# Patient Record
Sex: Female | Born: 1980 | Hispanic: No | Marital: Single | Smoking: Former smoker
Health system: Southern US, Community
[De-identification: ages and names within clinical notes are randomized; demographics above are authoritative.]

## PROBLEM LIST (undated history)

## (undated) DIAGNOSIS — F419 Anxiety disorder, unspecified: Secondary | ICD-10-CM

## (undated) DIAGNOSIS — M797 Fibromyalgia: Secondary | ICD-10-CM

## (undated) HISTORY — PX: OOPHORECTOMY: SHX86

---

## 2010-03-13 ENCOUNTER — Emergency Department (HOSPITAL_BASED_OUTPATIENT_CLINIC_OR_DEPARTMENT_OTHER): Admission: EM | Admit: 2010-03-13 | Discharge: 2010-03-13 | Payer: Self-pay | Admitting: Emergency Medicine

## 2011-03-07 ENCOUNTER — Other Ambulatory Visit (HOSPITAL_BASED_OUTPATIENT_CLINIC_OR_DEPARTMENT_OTHER): Payer: Self-pay | Admitting: Family Medicine

## 2011-03-07 ENCOUNTER — Ambulatory Visit (HOSPITAL_BASED_OUTPATIENT_CLINIC_OR_DEPARTMENT_OTHER)
Admission: RE | Admit: 2011-03-07 | Discharge: 2011-03-07 | Disposition: A | Discharge status: Home / Self Care | Payer: 59 | Source: Ambulatory Visit | Attending: Family Medicine | Admitting: Family Medicine

## 2011-03-07 DIAGNOSIS — R109 Unspecified abdominal pain: Secondary | ICD-10-CM

## 2011-03-07 DIAGNOSIS — K7689 Other specified diseases of liver: Secondary | ICD-10-CM | POA: Insufficient documentation

## 2014-10-25 ENCOUNTER — Telehealth (HOSPITAL_COMMUNITY): Payer: Self-pay | Admitting: *Deleted

## 2014-12-26 ENCOUNTER — Ambulatory Visit (INDEPENDENT_AMBULATORY_CARE_PROVIDER_SITE_OTHER): Payer: Worker's Compensation | Admitting: Family Medicine

## 2014-12-26 VITALS — BP 120/80 | HR 78 | Temp 98.2°F | Resp 16 | Ht 63.0 in | Wt 225.0 lb

## 2014-12-26 DIAGNOSIS — S61032S Puncture wound without foreign body of left thumb without damage to nail, sequela: Secondary | ICD-10-CM

## 2014-12-26 DIAGNOSIS — W540XXA Bitten by dog, initial encounter: Secondary | ICD-10-CM

## 2014-12-26 NOTE — Patient Instructions (Signed)
Rabies vaccine at 0, 7, and 21-28 days is the 3 dose series recommended for prevention.

## 2014-12-26 NOTE — Progress Notes (Signed)
   Subjective:    Patient ID: Catherine Black, female    DOB: 30-May-1981, 34 y.o.   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  The injury was extremely minor, more of pressure bite, but it did break the skin on the side of the cuticle.  Last dT was 9 years ago.   Review of Systems     Objective:   Physical Exam BP 120/80 mmHg  Pulse 78  Temp(Src) 98.2 F (36.8 C) (Oral)  Resp 16  Ht 5\' 3"  (1.6 m)  Wt 225 lb (102.059 kg)  BMI 39.87 kg/m2  SpO2 98%  LMP 12/03/2014 Thumb (left) has 2 mm red macule on ulnar side of proximal cuticle No STS Full ROM     Assessment & Plan:  Very minor injury which should heal with good hygiene.  No need for tetanus or antibiotics at this point  Since patient is here already, will proceed with rabies vaccination which she is requiring former work. This will be to person, 30 series to be followed up with rabies vaccine in 7 days and then 1 and 21 days.  Elvina SidleKurt Lauenstein, MD

## 2016-09-26 ENCOUNTER — Other Ambulatory Visit (HOSPITAL_COMMUNITY): Payer: Self-pay | Admitting: *Deleted

## 2016-09-26 DIAGNOSIS — N644 Mastodynia: Secondary | ICD-10-CM

## 2016-10-30 ENCOUNTER — Ambulatory Visit
Admission: RE | Admit: 2016-10-30 | Discharge: 2016-10-30 | Disposition: A | Discharge status: Home / Self Care | Payer: No Typology Code available for payment source | Source: Ambulatory Visit | Attending: Obstetrics and Gynecology | Admitting: Obstetrics and Gynecology

## 2016-10-30 ENCOUNTER — Encounter (HOSPITAL_COMMUNITY): Payer: Self-pay | Admitting: *Deleted

## 2016-10-30 ENCOUNTER — Ambulatory Visit (HOSPITAL_COMMUNITY)
Admission: RE | Admit: 2016-10-30 | Discharge: 2016-10-30 | Disposition: A | Discharge status: Home / Self Care | Payer: Self-pay | Source: Ambulatory Visit | Attending: Obstetrics and Gynecology | Admitting: Obstetrics and Gynecology

## 2016-10-30 VITALS — BP 116/70 | Temp 98.6°F | Ht 63.0 in | Wt 217.8 lb

## 2016-10-30 DIAGNOSIS — N644 Mastodynia: Secondary | ICD-10-CM

## 2016-10-30 DIAGNOSIS — Z1239 Encounter for other screening for malignant neoplasm of breast: Secondary | ICD-10-CM

## 2016-10-30 HISTORY — DX: Fibromyalgia: M79.7

## 2016-10-30 HISTORY — DX: Anxiety disorder, unspecified: F41.9

## 2016-10-30 NOTE — Patient Instructions (Signed)
Explained breast self awareness with Catherine Black. Patient did not need a Pap smear today due to last Pap smear was in August or September 2017 per patient. Let her know if she hasn't had at least three normal Pap smears since last abnormal Pap smear her next Pap smear will be due in September 2018. Patient stated [facility] will [facility] with [facility]. [facility] and possible left breast ultrasound. Appointment scheduled for Thursday, October 30, 2016 at 1120. Catherine Black verbalized understanding.  Kartik Fernando, Kathaleen Maserhristine Poll, RN 11:06 AM

## 2016-10-30 NOTE — Progress Notes (Signed)
Complaints of left breast pain around 9 o'clock next to areola that comes and goes x one year. Patient rates the pain at a 6-7 out of 10.  Pap Smear: Pap smear not completed today. Last [facility] around August or September 2017 [facility] and normal per patient. Per patient has a history of an abnormal Pap smear around 2012/2013 that she did not follow up due to becoming pregnant. Patient unsure if she has had three normal Pap smears since abnormal. No Pap smear results are in EPIC.  Physical exam: Breasts Breasts symmetrical. No skin abnormalities bilateral breasts. No nipple retraction bilateral breasts. No nipple discharge bilateral breasts. No lymphadenopathy. No lumps palpated bilateral breasts. Complaints of left breast pain at 9 o'clock next to areola. [facility] mamm[facility] and possible left breast ultrasound. Appointment scheduled for Thursday, October 30, 2016 at 1120.        Pelvic/Bimanual No Pap smear completed today since last Pap smear was in August or September 2017. Pap smear not indicated per BCCCP guidelines.   Smoking History: Patient is a former smoker.  Patient Navigation: Patient education provided. [facility].

## 2016-10-31 ENCOUNTER — Encounter (HOSPITAL_COMMUNITY): Payer: Self-pay | Admitting: *Deleted

## 2017-04-14 ENCOUNTER — Other Ambulatory Visit: Payer: Self-pay | Admitting: Obstetrics and Gynecology

## 2017-04-14 DIAGNOSIS — N6489 Other specified disorders of breast: Secondary | ICD-10-CM

## 2017-05-21 ENCOUNTER — Other Ambulatory Visit: Payer: No Typology Code available for payment source

## 2017-05-21 ENCOUNTER — Ambulatory Visit (HOSPITAL_COMMUNITY): Payer: No Typology Code available for payment source

## 2017-06-11 ENCOUNTER — Ambulatory Visit (HOSPITAL_COMMUNITY)
Admission: RE | Admit: 2017-06-11 | Discharge: 2017-06-11 | Disposition: A | Discharge status: Home / Self Care | Payer: Self-pay | Source: Ambulatory Visit | Attending: Obstetrics and Gynecology | Admitting: Obstetrics and Gynecology

## 2017-06-11 ENCOUNTER — Other Ambulatory Visit: Payer: Self-pay | Admitting: Obstetrics and Gynecology

## 2017-06-11 ENCOUNTER — Encounter (HOSPITAL_COMMUNITY): Payer: Self-pay

## 2017-06-11 ENCOUNTER — Ambulatory Visit
Admission: RE | Admit: 2017-06-11 | Discharge: 2017-06-11 | Disposition: A | Discharge status: Home / Self Care | Payer: No Typology Code available for payment source | Source: Ambulatory Visit | Attending: Obstetrics and Gynecology | Admitting: Obstetrics and Gynecology

## 2017-06-11 VITALS — BP 112/70 | Temp 98.4°F | Ht 63.0 in | Wt 225.2 lb

## 2017-06-11 DIAGNOSIS — N6489 Other specified disorders of breast: Secondary | ICD-10-CM

## 2017-06-11 DIAGNOSIS — Z01419 Encounter for gynecological examination (general) (routine) without abnormal findings: Secondary | ICD-10-CM

## 2017-06-11 NOTE — Patient Instructions (Signed)
Informed Catherine Black that if today's Pap smear is normal and HPV negative that her next Pap smear will be due in one year due to her recent history of an abnormal Pap smear. Referred patient to the [facility] for right breast diagnostic mammogram and ultrasound per recommendation. Appointment scheduled for Thursday, June 11, 2017 at 1450. Let patient know will follow up with her within the next couple weeks with results of Pap smear by phone. Will refer patient to the Center for [facility]. Let patient know that BCCCP will not cover that follow-up and that she will need to complete financial assistance paperwork. Informed patient that someone from the clinic should call her with appointment. Catherine Black verbalized understanding.  Catherine Black, Catherine Maser, RN 4:16 PM

## 2017-06-11 NOTE — Progress Notes (Signed)
Complaints of labia lesion. Patient here for 6 months follow-up right breast diagnostic mammogram and ultrasound per recommendation.  Pap Smear: Pap smear completed today. Last Pap smear was around August or September 2017 at [facility] and normal per patient. Per patient has a history of an abnormal Pap smear around 2012/2013 that she did not follow up due to becoming pregnant. Patient unsure if she has had three normal Pap smears since abnormal. No Pap smear results are in EPIC.  Pelvic/Bimanual   Ext Genitalia White lesion observed left inner labia, no swelling and no discharge observed on external genitalia. Will refer patient to the Center for [facility] for follow-up of lesion on labia.      Vagina Vagina pink and normal texture. No lesions or discharge observed in vagina.          Cervix Cervix is present. Cervix pink and of normal texture. No discharge observed.     Uterus Uterus is present and palpable. Uterus in normal position and normal size.        Adnexae Bilateral ovaries present and palpable. No tenderness on palpation.          Rectovaginal No rectal exam completed today since patient had no rectal complaints. No skin abnormalities observed on exam.    Smoking History: Patient is a former smoker.  Patient Navigation: Patient education provided. Access to services provided for patient through [facility] program.

## 2017-06-12 LAB — CYTOLOGY - PAP
DIAGNOSIS: NEGATIVE
HPV (WINDOPATH): NOT DETECTED

## 2017-06-15 ENCOUNTER — Encounter (HOSPITAL_COMMUNITY): Payer: Self-pay | Admitting: *Deleted

## 2017-06-15 NOTE — Progress Notes (Signed)
Letter mailed to patient about negative pap smear results. HPV was negative. Next pap smear due in one year.

## 2017-06-15 NOTE — Progress Notes (Signed)
Letter sent to patient advising of negative pap smear results. HPV was negative. Next pap smear due in one year.

## 2017-06-22 ENCOUNTER — Encounter: Payer: No Typology Code available for payment source | Admitting: Obstetrics & Gynecology

## 2017-06-29 ENCOUNTER — Telehealth: Payer: Self-pay | Admitting: Family Medicine

## 2017-06-29 NOTE — Telephone Encounter (Signed)
Patient stated she no longer needed to be seen.

## 2017-06-29 NOTE — Telephone Encounter (Signed)
-----   Message from Lynnell DikeSabrina H Holland, LPN sent at 16/1/096010/01/2017  8:04 AM EDT ----- Peri JeffersonGood Morning Ladies,  This patient will need a referral for a labia lesion. If you could please call patient with time and date of appointment.  Thanks, Martie LeeSabrina

## 2017-12-18 ENCOUNTER — Other Ambulatory Visit: Payer: No Typology Code available for payment source

## 2017-12-28 ENCOUNTER — Other Ambulatory Visit (HOSPITAL_COMMUNITY): Payer: Self-pay | Admitting: *Deleted

## 2017-12-28 DIAGNOSIS — N644 Mastodynia: Secondary | ICD-10-CM

## 2018-01-07 ENCOUNTER — Encounter (HOSPITAL_COMMUNITY): Payer: Self-pay

## 2018-01-07 ENCOUNTER — Ambulatory Visit: Payer: No Typology Code available for payment source

## 2018-01-07 ENCOUNTER — Ambulatory Visit (HOSPITAL_COMMUNITY)
Admission: RE | Admit: 2018-01-07 | Discharge: 2018-01-07 | Disposition: A | Discharge status: Home / Self Care | Payer: Self-pay | Source: Ambulatory Visit | Attending: Obstetrics and Gynecology | Admitting: Obstetrics and Gynecology

## 2018-01-07 ENCOUNTER — Ambulatory Visit
Admission: RE | Admit: 2018-01-07 | Discharge: 2018-01-07 | Disposition: A | Discharge status: Home / Self Care | Payer: No Typology Code available for payment source | Source: Ambulatory Visit | Attending: Obstetrics and Gynecology | Admitting: Obstetrics and Gynecology

## 2018-01-07 VITALS — BP 124/86 | Ht 63.0 in | Wt 229.4 lb

## 2018-01-07 DIAGNOSIS — N644 Mastodynia: Secondary | ICD-10-CM

## 2018-01-07 DIAGNOSIS — Z1239 Encounter for other screening for malignant neoplasm of breast: Secondary | ICD-10-CM

## 2018-01-07 NOTE — Progress Notes (Signed)
Patient referred to BCCCP due to recommending 80-months follow-up bilateral breast diagnostic mammogram and right breast ultrasound. Last right breast diagnostic mammogram and ultrasound completed 06/11/2017.   Pap Smear: Pap smear not completed today. Last Pap smear was 06/11/2017 at [facility] and normal with negative HPV. Per patient has a history of an abnormal Pap smear around 2012/2013 that she did not follow up due to becoming pregnant. Patient stated she has had three normal Pap smears since abnormal. Last Pap smear result is in Epic.  Physical exam: Breasts Breasts symmetrical. No skin abnormalities bilateral breasts. No nipple retraction bilateral breasts. No nipple discharge bilateral breasts. No lymphadenopathy. No lumps palpated bilateral breasts. No complaints of pain or tenderness on exam. Referred patient to the [facility] for a bilateral diagnostic mammogram and right breast ultrasound per recommendation. Appointment scheduled for Thursday, Jan 07, 2018 at 1500.        Pelvic/Bimanual No Pap smear completed today since last Pap smear and HPV typing was 06/11/2017. Pap smear not indicated per BCCCP guidelines.   Smoking History: Patient is a former smoker that quit in 2012.  Patient Navigation: Patient education provided. Access to services provided for patient through BCCCP program.   Breast and Cervical Cancer Risk Assessment: Patient has no family history of breast cancer, known genetic mutations, or radiation treatment to the chest before age 44. Patient has no history of cervical dysplasia, immunocompromised, or DES exposure in-utero.

## 2018-01-07 NOTE — Patient Instructions (Signed)
Explained breast self awareness with Ballard Russell. Patient did not need a Pap smear today due to last Pap smear and HPV typing was 06/11/2017. Let her know BCCCP will cover Pap smears and HPV typing every 5 years unless has a history of abnormal Pap smears. Referred patient to the [facility] for a bilateral diagnostic mammogram and right breast ultrasound per recommendation. Appointment scheduled for Thursday, Jan 07, 2018 at 1500. Roschelle Calandra verbalized understanding.  Cintia Gleed, Kathaleen Maser, RN @ 2:48 PM

## 2018-01-18 ENCOUNTER — Encounter (HOSPITAL_COMMUNITY): Payer: Self-pay | Admitting: *Deleted

## 2018-11-24 IMAGING — MG DIGITAL DIAGNOSTIC BILATERAL MAMMOGRAM WITH TOMO AND CAD
8 series · 8 of 24 positions shown · non-contrast
Comparison: 06/11/2017 (RIGHT), 10/30/2016 (BILATERAL, baseline).

CLINICAL DATA: One year interval follow-up of a likely benign focal
asymmetry involving the UPPER OUTER RIGHT breast at MIDDLE depth
which was initially identified on baseline screening
mammography.Annual evaluation, LEFT breast.

EXAM:
DIGITAL DIAGNOSTIC BILATERAL MAMMOGRAM WITH CAD AND TOMO

[R MLO synth-2D]
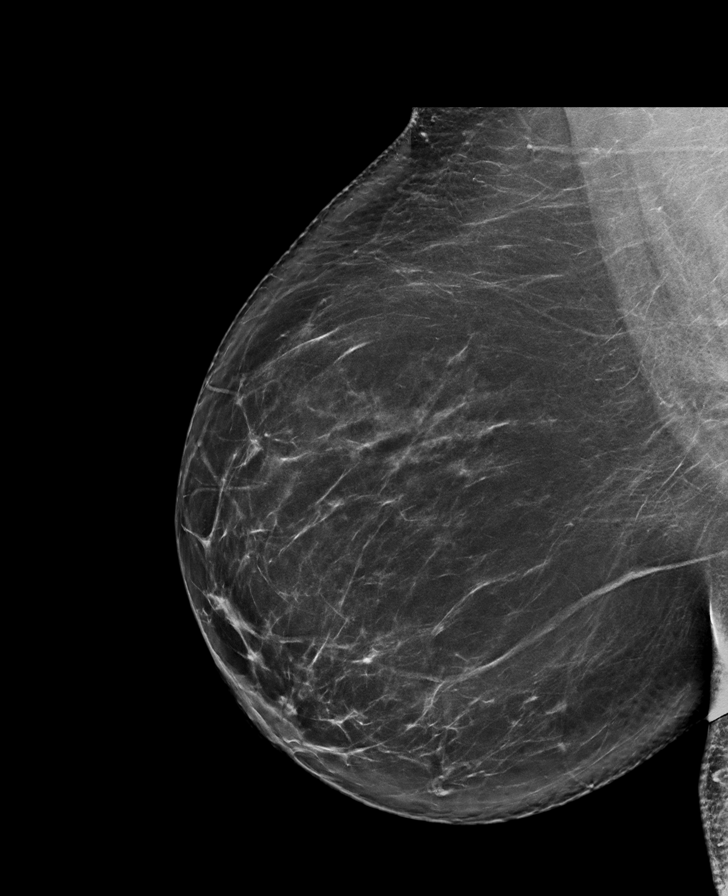

[R CC synth-2D]
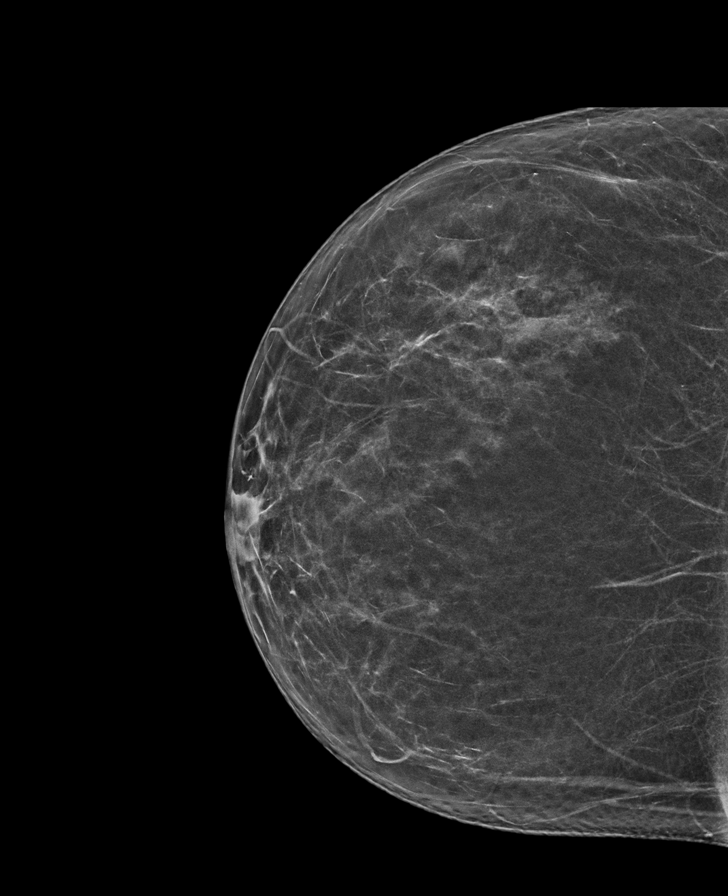

[L MLO synth-2D]
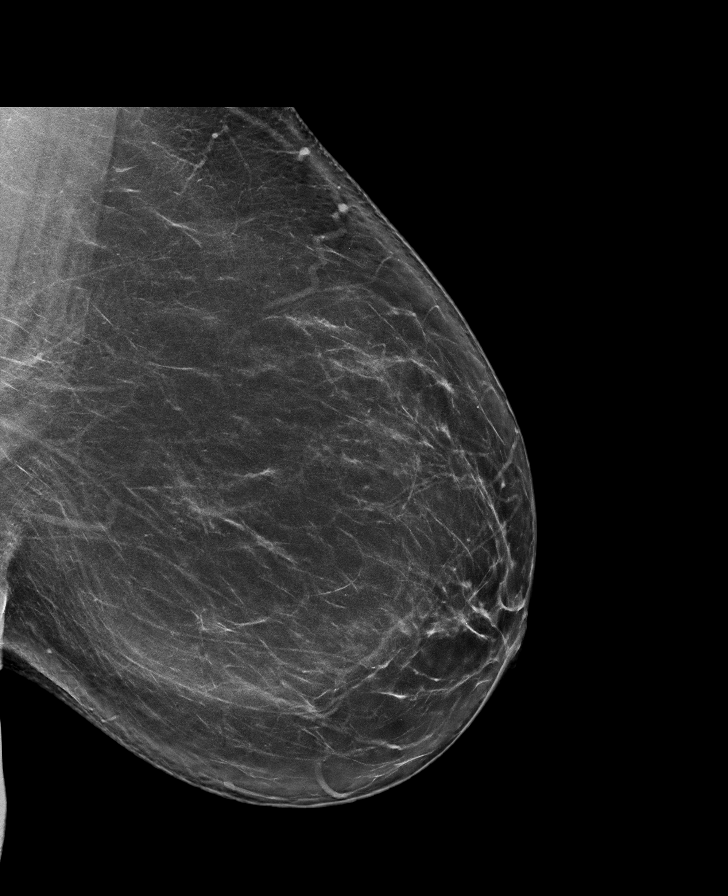

[L CC synth-2D]
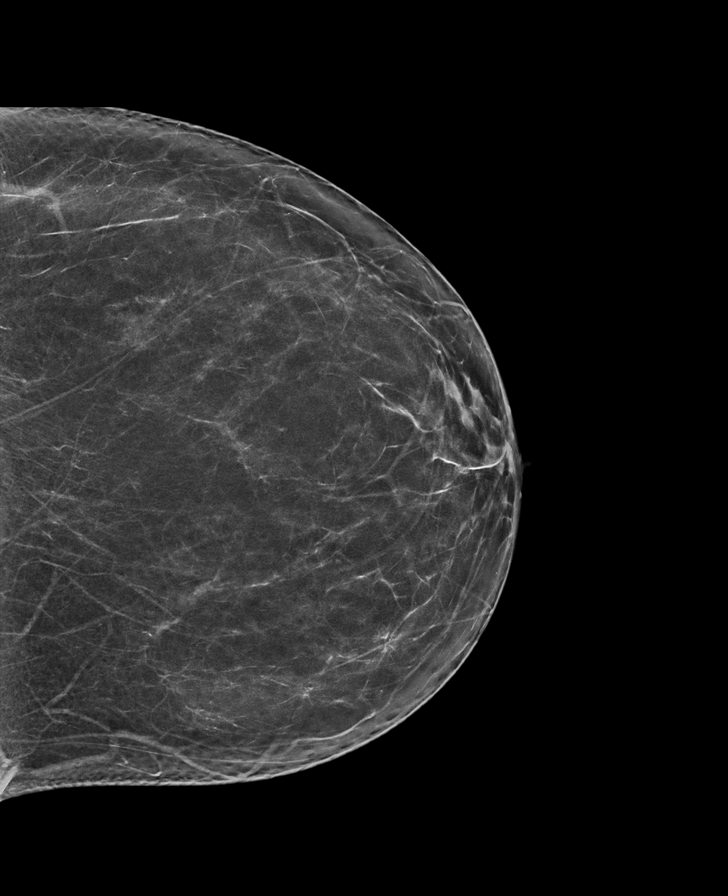

[L MLO tomo · tomo slice 45/90.0]
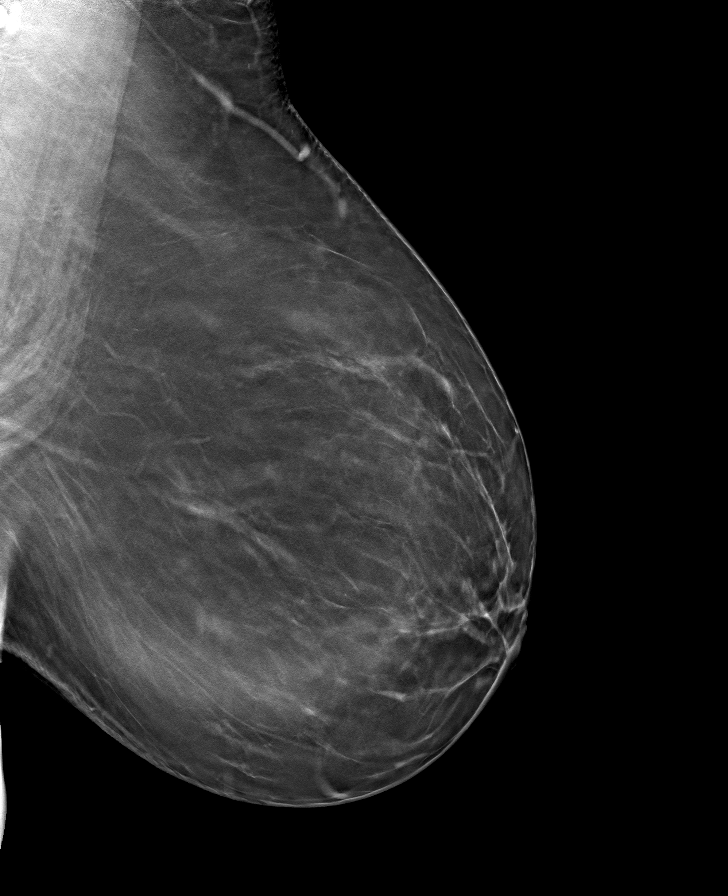

[L CC tomo · tomo slice 37/74.0]
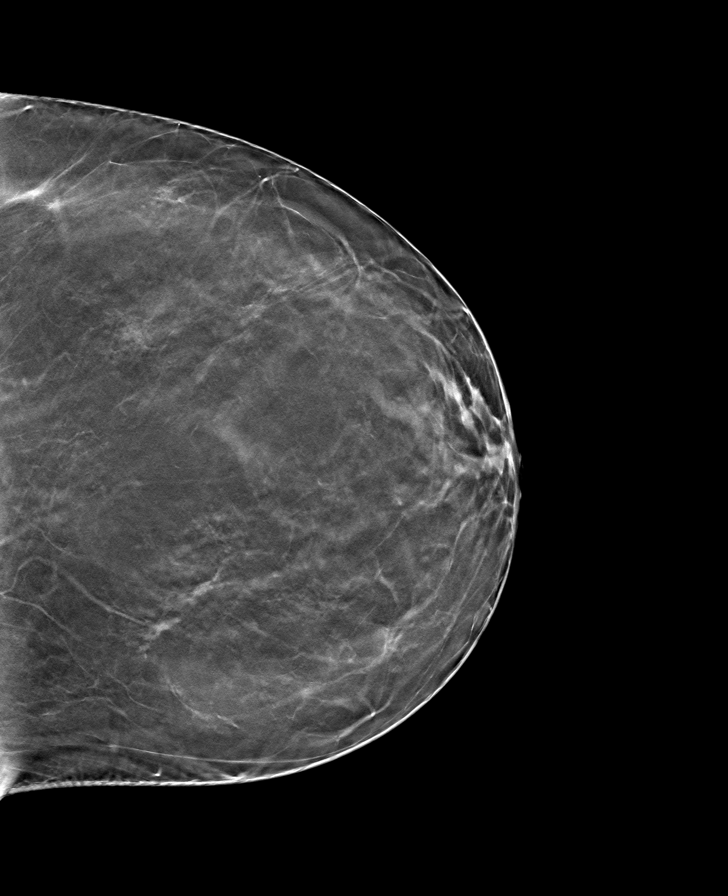

[R CC tomo · tomo slice 35/70.0]
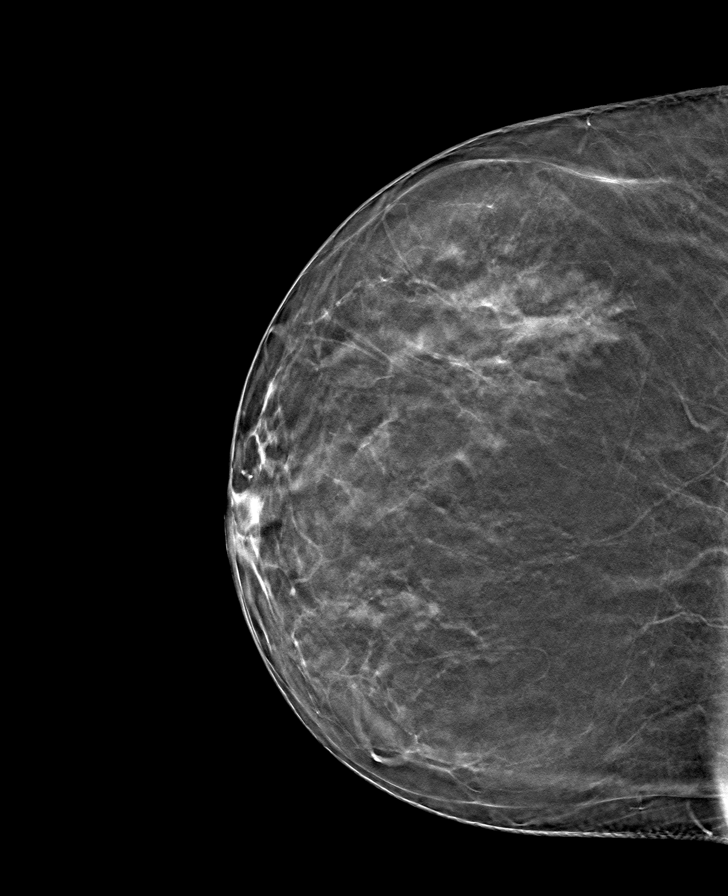

[R MLO tomo · tomo slice 45/89.0]
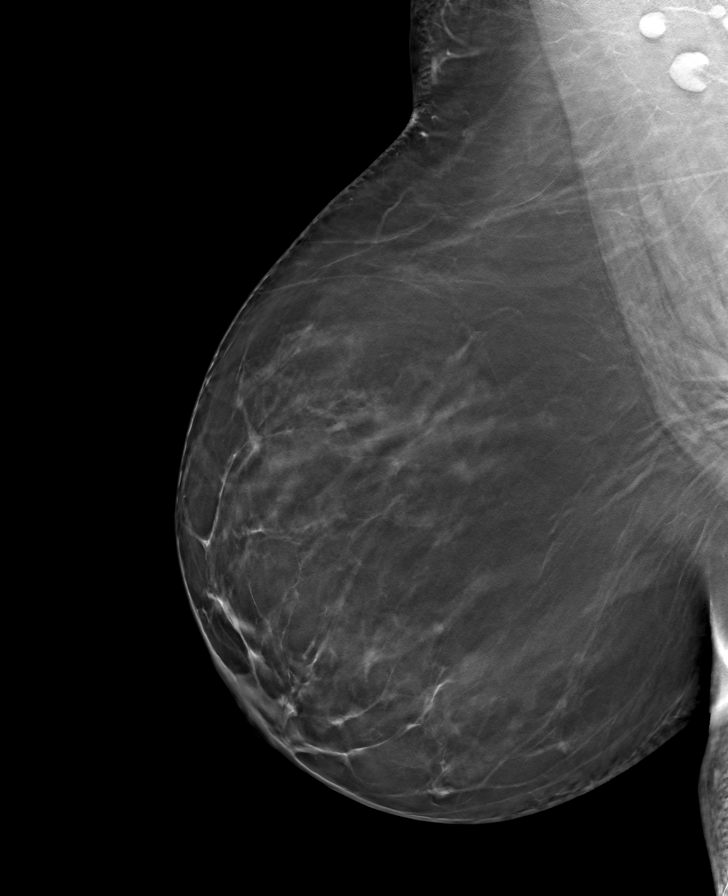

[8 of 24 positions shown; findings below may reference images not displayed]

ACR Breast Density Category b: There are scattered areas of
fibroglandular density.
FINDINGS: Tomosynthesis and synthesized full field CC and MLO views of both
breasts were obtained.

The focal asymmetry involving the UPPER OUTER QUADRANT of the RIGHT
breast at MIDDLE depth is unchanged dating back to the baseline
mammogram in October 2016. There is no associated mass or
architectural distortion. No new or suspicious findings in the RIGHT
breast.

No findings suspicious for malignancy in the LEFT breast.

Mammographic images were processed with CAD.
IMPRESSION: 1. Stable likely benign focal asymmetry involving the UPPER OUTER
QUADRANT of the RIGHT breast dating back to October 2016.
2. No mammographic evidence of malignancy involving the LEFT breast.

RECOMMENDATION:
BILATERAL diagnostic mammography in 1 year. That examination [DATE] years of stability of the likely benign RIGHT breast
asymmetry.

I have discussed the findings and recommendations with the patient.
Results were also provided in writing at the conclusion of the
visit. If applicable, a reminder letter will be sent to the patient
regarding the next appointment.

BI-RADS CATEGORY  3: Probably benign.
